# Patient Record
Sex: Female | Born: 1965 | Race: Black or African American | Hispanic: No | Marital: Single
Health system: Southern US, Community
[De-identification: ages and names within clinical notes are randomized; demographics above are authoritative.]

---

## 2020-03-22 NOTE — Progress Notes (Unsigned)
[name] [address] [place] [address] Phone: [phone] Subjective:   Bruce Donath, am serving as a scribe for Dr. Antoine Primas. This visit occurred during the SARS-CoV-2 public health emergency.  Safety protocols were in place, including screening questions prior to the visit, additional usage of staff PPE, and extensive cleaning of exam room while observing appropriate contact time as indicated for disinfecting solutions.   I'm seeing this patient by the request  of:  Patient, No Pcp Per  CC: Right wrist pain and hand pain  [id number]  Andrea Cowan is a 55 y.o. female coming in with complaint of right hand and wrist pain. Patient states that symptoms began last year as tingling in all of her fingertips on right hand. Thumb started to catch and is now not able to flex thumb. Pain in [facility] joint and dorsum of wrist. States that "swelling" pops up in this wrist intermittently. 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      Social History   Socioeconomic History  . Marital status: Married    Spouse name: Not on file  . Number of children: Not on file  . Years of education: Not on file  . Highest education level: Not on file  Occupational History  . Not on file  Tobacco Use  . Smoking status: Not on file  . Smokeless tobacco: Not on file  Substance and Sexual Activity  . Alcohol use: Not on file  . Drug use: Not on file  . Sexual activity: Not on file  Other Topics Concern  . Not on file  Social History Narrative  . Not on file   Social Determinants of Health   Financial Resource Strain: Not on file  Food Insecurity: Not on file  Transportation Needs: Not on file  Physical Activity: Not on file  Stress: Not on file  Social Connections: Not on file   Not on File No family history on file.   Current Outpatient Medications (Cardiovascular):  .  triamterene-hydrochlorothiazide (DYAZIDE) 37.5-25 MG capsule,  Take 1 capsule by mouth daily.       Reviewed prior external information including notes and imaging from  primary care provider As well as notes that were available from care everywhere and other healthcare systems.  Past medical history, social, surgical and family history all reviewed in electronic medical record.  No pertanent information unless stated regarding to the chief complaint.   Review of Systems:  No headache, visual changes, nausea, vomiting, diarrhea, constipation, dizziness, abdominal pain, skin rash, fevers, chills, night sweats, weight loss, swollen lymph nodes, body aches, joint swelling, chest pain, shortness of breath, mood changes. POSITIVE muscle aches  Objective  Blood pressure 112/80, pulse 94, height 5\' 2"  (1.575 m), weight 165 lb (74.8 kg), SpO2 98 %.   General: No apparent distress alert and oriented x3 mood and affect normal, dressed appropriately.  HEENT: Pupils equal, extraocular movements intact  Respiratory: Patient's speak in full sentences and does not appear short of breath  Cardiovascular: No lower extremity edema, non tender, no erythema  Gait normal with good balance and coordination.  MSK: Right hand exam shows the patient does have trigger nodule noted at the A2 pulley.  Severely tender to palpation.  Patient has difficulty on her own and moving her thumb at all.  I am able to flex patient's thumb down and then patient does have audible clicking when trying to extend again.  No pain over the [facility]  joint.  Positive Phalen's noted.  Negative Tinel's test though.  Good grip strength noted.  Limited musculoskeletal ultrasound was performed and interpreted by Judi Saa  Limited ultrasound shows the patient does have what appears to be a nodule within the tendon sheath of the first flexor tendon sheath of the right hand consistent with a trigger nodule.  Patient also has a bifid median nerve noted but no significant inflammation noted at this point.   Mild arthritic changes diffusely of the wrist otherwise. Impression: Trigger thumb and bifid median nerve that could be consistent with mild carpal tunnel    Impression and Recommendations:     The above documentation has been reviewed and is accurate and complete Judi Saa, DO

## 2020-03-23 ENCOUNTER — Encounter: Payer: Self-pay | Admitting: Family Medicine

## 2020-03-23 ENCOUNTER — Ambulatory Visit: Payer: BC Managed Care – PPO | Admitting: Family Medicine

## 2020-03-23 ENCOUNTER — Other Ambulatory Visit: Payer: Self-pay

## 2020-03-23 ENCOUNTER — Ambulatory Visit: Payer: Self-pay

## 2020-03-23 ENCOUNTER — Ambulatory Visit (INDEPENDENT_AMBULATORY_CARE_PROVIDER_SITE_OTHER): Payer: BC Managed Care – PPO

## 2020-03-23 VITALS — BP 112/80 | HR 94 | Ht 62.0 in | Wt 165.0 lb

## 2020-03-23 DIAGNOSIS — M65312 Trigger thumb, left thumb: Secondary | ICD-10-CM | POA: Insufficient documentation

## 2020-03-23 DIAGNOSIS — M65311 Trigger thumb, right thumb: Secondary | ICD-10-CM | POA: Diagnosis not present

## 2020-03-23 DIAGNOSIS — G5601 Carpal tunnel syndrome, right upper limb: Secondary | ICD-10-CM | POA: Diagnosis not present

## 2020-03-23 DIAGNOSIS — M79641 Pain in right hand: Secondary | ICD-10-CM | POA: Diagnosis not present

## 2020-03-23 DIAGNOSIS — G5600 Carpal tunnel syndrome, unspecified upper limb: Secondary | ICD-10-CM | POA: Insufficient documentation

## 2020-03-23 NOTE — Assessment & Plan Note (Signed)
Mild to moderate.  Patient does seem to have more of a bifid nerve noted.  Patient does have a mild positive Phalen's test but a negative Tinel's.  Good grip strength at the moment.  X-rays pending.  Thumb spica splint given to wear at night.  Hold on medication for worsening pain consider physical therapy as well as gabapentin.

## 2020-03-23 NOTE — Assessment & Plan Note (Signed)
Patient does have a trigger thumb noted.  We discussed with patient in great length.  Concerned that this has been going on for so long.  Patient wants to hold on any type of injection.  We did reiterate that that could be beneficial.  Discussed warm compresses, bracing at night, as well as massage.  Patient will follow up in 3 weeks and if not better we will consider injection and potentially occupational therapy secondary to the patient not using this for so long.

## 2020-03-23 NOTE — Patient Instructions (Addendum)
Warm compress and massage to area Thumb spica splint Bi-fib median nerve Wrist xray today See me in 4 weeks

## 2020-04-24 NOTE — Progress Notes (Signed)
[name] [address] [place] [address] Phone: [phone] Subjective:   Bruce Donath, am serving as a scribe for Dr. Antoine Primas. This visit occurred during the SARS-CoV-2 public health emergency.  Safety protocols were in place, including screening questions prior to the visit, additional usage of staff PPE, and extensive cleaning of exam room while observing appropriate contact time as indicated for disinfecting solutions.   I'm seeing this patient by the request  of:  Patient, No Pcp Per (Inactive)  CC: Hand pain follow-up  [id number]   03/23/2020 Mild to moderate.  Patient does seem to have more of a bifid nerve noted.  Patient does have a mild positive Phalen's test but a negative Tinel's.  Good grip strength at the moment.  X-rays pending.  Thumb spica splint given to wear at night.  Hold on medication for worsening pain consider physical therapy as well as gabapentin.  Patient does have a trigger thumb noted.  We discussed with patient in great length.  Concerned that this has been going on for so long.  Patient wants to hold on any type of injection.  We did reiterate that that could be beneficial.  Discussed warm compresses, bracing at night, as well as massage.  Patient will follow up in 3 weeks and if not better we will consider injection and potentially occupational therapy secondary to the patient not using this for so long.  Update 04/25/2020 Hollis Oh is a 55 y.o. female coming in with complaint of  R hand pain.  Seen previously for carpal tunnel as well as trigger thumb.  Patient was to do conservative therapy. Patient is not able to flex thumb and has constant pain.       No past medical history on file. No past surgical history on file. Social History   Socioeconomic History  . Marital status: Married    Spouse name: Not on file  . Number of children: Not on file  . Years of education: Not on file  . Highest  education level: Not on file  Occupational History  . Not on file  Tobacco Use  . Smoking status: Not on file  . Smokeless tobacco: Not on file  Substance and Sexual Activity  . Alcohol use: Not on file  . Drug use: Not on file  . Sexual activity: Not on file  Other Topics Concern  . Not on file  Social History Narrative  . Not on file   Social Determinants of Health   Financial Resource Strain: Not on file  Food Insecurity: Not on file  Transportation Needs: Not on file  Physical Activity: Not on file  Stress: Not on file  Social Connections: Not on file   Not on File No family history on file.   Current Outpatient Medications (Cardiovascular):  .  triamterene-hydrochlorothiazide (DYAZIDE) 37.5-25 MG capsule, Take 1 capsule by mouth daily.       Reviewed prior external information including notes and imaging from  primary care provider As well as notes that were available from care everywhere and other healthcare systems.  Past medical history, social, surgical and family history all reviewed in electronic medical record.  No pertanent information unless stated regarding to the chief complaint.   Review of Systems:  No headache, visual changes, nausea, vomiting, diarrhea, constipation, dizziness, abdominal pain, skin rash, fevers, chills, night sweats, weight loss, swollen lymph nodes, body aches, joint swelling, chest pain, shortness of breath, mood changes. POSITIVE muscle  aches  Objective  There were no vitals taken for this visit.   General: No apparent distress alert and oriented x3 mood and affect normal, dressed appropriately.  HEENT: Pupils equal, extraocular movements intact  Respiratory: Patient's speak in full sentences and does not appear short of breath  Cardiovascular: No lower extremity edema, non tender, no erythema  Gait normal with good balance and coordination.  MSK:  Right hand exam shows the patient does have significant decrease in range of  motion of the thumb with painful extension.  Trigger nodule noted at the A2 pulley.  When isolating the DIP patient does still have some mild flexion mild against resistance.  Neurovascular intact distally.  Procedure: Real-time Ultrasound Guided Injection of right thumb flexor tendon sheath Device: GE Logiq Q7 Ultrasound guided injection is preferred based studies that show increased duration, increased effect, greater accuracy, decreased procedural pain, increased response rate, and decreased cost with ultrasound guided versus blind injection.  Verbal informed consent obtained.  Time-out conducted.  Noted no overlying erythema, induration, or other signs of local infection.  Skin prepped in a sterile fashion.  Local anesthesia: Topical Ethyl chloride.  With sterile technique and under real time ultrasound guidance: With a 25-gauge 1 inch needle injected with 0.5 cc of 0.5% Marcaine and 0.5 cc of Kenalog mentioned. Completed without difficulty  Pain immediately resolved suggesting accurate placement of the medication.  Advised to call if fevers/chills, erythema, induration, drainage, or persistent bleeding.  Impression: Technically successful ultrasound guided injection.    Impression and Recommendations:     The above documentation has been reviewed and is accurate and complete Judi Saa, DO

## 2020-04-25 ENCOUNTER — Other Ambulatory Visit: Payer: Self-pay

## 2020-04-25 ENCOUNTER — Ambulatory Visit: Payer: BC Managed Care – PPO | Admitting: Family Medicine

## 2020-04-25 ENCOUNTER — Ambulatory Visit: Payer: Self-pay

## 2020-04-25 ENCOUNTER — Encounter: Payer: Self-pay | Admitting: Family Medicine

## 2020-04-25 VITALS — BP 126/88 | HR 72 | Ht 62.0 in | Wt 167.0 lb

## 2020-04-25 DIAGNOSIS — M79641 Pain in right hand: Secondary | ICD-10-CM

## 2020-04-25 DIAGNOSIS — M65311 Trigger thumb, right thumb: Secondary | ICD-10-CM | POA: Diagnosis not present

## 2020-04-25 NOTE — Patient Instructions (Addendum)
Injected thumb today May take 2 weeks or so to start working Wear brace at night See me again in 6 weeks

## 2020-04-25 NOTE — Assessment & Plan Note (Signed)
Patient given injection today.  Tolerated the procedure well.  Failed conservative therapy.  I am optimistic that patient should respond relatively well.  Discussed with patient to start working on the range of motion noted.  We discussed bracing only at night.  Follow-up with me again 6 weeks to further evaluate.  Worsening pain nonspecific inflammation or redness patient is a 59 medical attention.

## 2020-06-02 NOTE — Progress Notes (Deleted)
  [name] [address] [place] [address] Phone: [phone] Subjective:    I'm seeing this patient by the request  of:  Patient, No Pcp Per (Inactive)  CC:   [id number]   04/25/2020 Patient given injection today.  Tolerated the procedure well.  Failed conservative therapy.  I am optimistic that patient should respond relatively well.  Discussed with patient to start working on the range of motion noted.  We discussed bracing only at night.  Follow-up with me again 6 weeks to further evaluate.  Worsening pain nonspecific inflammation or redness patient is a 60 medical attention  Update 06/06/2020 Andrea Cowan is a 55 y.o. female coming in with complaint of R trigger thumb. Patient states   Onset-  Location Duration-  Character- Aggravating factors- Reliving factors-  Therapies tried-  Severity-     No past medical history on file. No past surgical history on file. Social History   Socioeconomic History  . Marital status: Married    Spouse name: Not on file  . Number of children: Not on file  . Years of education: Not on file  . Highest education level: Not on file  Occupational History  . Not on file  Tobacco Use  . Smoking status: Not on file  . Smokeless tobacco: Not on file  Substance and Sexual Activity  . Alcohol use: Not on file  . Drug use: Not on file  . Sexual activity: Not on file  Other Topics Concern  . Not on file  Social History Narrative  . Not on file   Social Determinants of Health   Financial Resource Strain: Not on file  Food Insecurity: Not on file  Transportation Needs: Not on file  Physical Activity: Not on file  Stress: Not on file  Social Connections: Not on file   Not on File No family history on file.   Current Outpatient Medications (Cardiovascular):  .  triamterene-hydrochlorothiazide (DYAZIDE) 37.5-25 MG capsule, Take 1 capsule by mouth daily.       Reviewed prior  external information including notes and imaging from  primary care provider As well as notes that were available from care everywhere and other healthcare systems.  Past medical history, social, surgical and family history all reviewed in electronic medical record.  No pertanent information unless stated regarding to the chief complaint.   Review of Systems:  No headache, visual changes, nausea, vomiting, diarrhea, constipation, dizziness, abdominal pain, skin rash, fevers, chills, night sweats, weight loss, swollen lymph nodes, body aches, joint swelling, chest pain, shortness of breath, mood changes. POSITIVE muscle aches  Objective  There were no vitals taken for this visit.   General: No apparent distress alert and oriented x3 mood and affect normal, dressed appropriately.  HEENT: Pupils equal, extraocular movements intact  Respiratory: Patient's speak in full sentences and does not appear short of breath  Cardiovascular: No lower extremity edema, non tender, no erythema  Gait normal with good balance and coordination.  MSK:  Non tender with full range of motion and good stability and symmetric strength and tone of shoulders, elbows, wrist, hip, knee and ankles bilaterally.     Impression and Recommendations:     The above documentation has been reviewed and is accurate and complete Wilford Grist

## 2020-06-06 ENCOUNTER — Ambulatory Visit: Payer: BC Managed Care – PPO | Admitting: Family Medicine

## 2021-04-03 NOTE — Progress Notes (Deleted)
?  Andrea Files D.O. ?[facility] ?[address] [place] [address] ?Phone: [phone] ?Subjective:   ? ?I'm seeing this patient by the request  of:  Patient, No Pcp Per (Inactive) ? ?CC:  ? ?[id number]  ?04/25/2020 ?Patient given injection today.  Tolerated the procedure well.  Failed conservative therapy.  I am optimistic that patient should respond relatively well.  Discussed with patient to start working on the range of motion noted.  We discussed bracing only at night.  Follow-up with me again 6 weeks to further evaluate.  Worsening pain nonspecific inflammation or redness patient is a 60 medical attention. ? ?Updated 04/04/2021 ?Andrea Cowan is a 56 y.o. female coming in with complaint of hand and wrist pain ? ? ? ?  ? ?No past medical history on file. ?No past surgical history on file. ?Social History  ? ?Socioeconomic History  ? Marital status: Married  ?  Spouse name: Not on file  ? Number of children: Not on file  ? Years of education: Not on file  ? Highest education level: Not on file  ?Occupational History  ? Not on file  ?Tobacco Use  ? Smoking status: Not on file  ? Smokeless tobacco: Not on file  ?Substance and Sexual Activity  ? Alcohol use: Not on file  ? Drug use: Not on file  ? Sexual activity: Not on file  ?Other Topics Concern  ? Not on file  ?Social History Narrative  ? Not on file  ? ?Social Determinants of Health  ? ?Financial Resource Strain: Not on file  ?Food Insecurity: Not on file  ?Transportation Needs: Not on file  ?Physical Activity: Not on file  ?Stress: Not on file  ?Social Connections: Not on file  ? ?Not on File ?No family history on file. ? ? ?Current Outpatient Medications (Cardiovascular):  ?  triamterene-hydrochlorothiazide (DYAZIDE) 37.5-25 MG capsule, Take 1 capsule by mouth daily. ? ? ? ? ?Reviewed prior external information including notes and imaging from  ?primary care provider ?As well as notes that were available from care everywhere and other  healthcare systems. ? ?Past medical history, social, surgical and family history all reviewed in electronic medical record.  No pertanent information unless stated regarding to the chief complaint.  ? ?Review of Systems: ? No headache, visual changes, nausea, vomiting, diarrhea, constipation, dizziness, abdominal pain, skin rash, fevers, chills, night sweats, weight loss, swollen lymph nodes, body aches, joint swelling, chest pain, shortness of breath, mood changes. POSITIVE muscle aches ? ?Objective  ?There were no vitals taken for this visit. ?  ?General: No apparent distress alert and oriented x3 mood and affect normal, dressed appropriately.  ?HEENT: Pupils equal, extraocular movements intact  ?Respiratory: Patient's speak in full sentences and does not appear short of breath  ?Cardiovascular: No lower extremity edema, non tender, no erythema  ?Gait normal with good balance and coordination.  ?MSK:   ? ?  ?Impression and Recommendations:  ?  ? ?The above documentation has been reviewed and is accurate and complete Andrea Cowan ? ? ? ?

## 2021-04-04 ENCOUNTER — Ambulatory Visit: Payer: BC Managed Care – PPO | Admitting: Family Medicine

## 2021-04-17 ENCOUNTER — Ambulatory Visit: Payer: BC Managed Care – PPO | Admitting: Family Medicine

## 2021-04-17 ENCOUNTER — Ambulatory Visit: Payer: Self-pay

## 2021-04-17 VITALS — BP 124/84 | HR 82 | Ht 62.0 in | Wt 180.0 lb

## 2021-04-17 DIAGNOSIS — M65312 Trigger thumb, left thumb: Secondary | ICD-10-CM | POA: Diagnosis not present

## 2021-04-17 DIAGNOSIS — M79642 Pain in left hand: Secondary | ICD-10-CM

## 2021-04-17 DIAGNOSIS — M79641 Pain in right hand: Secondary | ICD-10-CM

## 2021-04-17 DIAGNOSIS — M65311 Trigger thumb, right thumb: Secondary | ICD-10-CM

## 2021-04-17 NOTE — Assessment & Plan Note (Signed)
Bilateral injections given today.  Chronic, with exacerbation.  Patient does report significant repetitive activity with her thumbs that is likely contributing.  Patient is to continue to do so she states.  We discussed bracing.  Discussed massage and heat follow-up with me again in 2 months.  Did discuss if this continues to give trouble possible surgical intervention will be necessary. ?

## 2021-04-17 NOTE — Patient Instructions (Addendum)
Good to see you ?Injected both hands today ?Have fun Myrtle  ?See me in 2 months ?

## 2021-04-17 NOTE — Progress Notes (Signed)
?Andrea Cowan D.O. ?[facility] ?[place] ?Phone: [phone] ?Subjective:   ?I, Andrea Cowan, am serving as a scribe for Dr. Hulan Cowan. ? ?I'm seeing this patient by the request  of:  Patient, No Pcp Per (Inactive) ? ?CC: bilateral thumb  ? ?[id number]  ?Andrea Cowan is a 56 y.o. female coming in with complaint of bilateral thumb and wrist pain. Stated with the trigger finger where she has to pull her thumb open. Patient is not having any pain n the right thumb she just cannot bend it. The left thumb however is painful and worse if she hits it on anything, also is unable to bend her thumb. The right thumb has been a couple a months this way the left thumb started clicking recently about a month ago. Patient has been trying to massage the area to help with the pain, takes tylenol when the pain gets really bad.   ? ? ? ?  ? ?No past medical history on file. ?No past surgical history on file. ?Social History  ? ?Socioeconomic History  ? Marital status: Married  ?  Spouse name: Not on file  ? Number of children: Not on file  ? Years of education: Not on file  ? Highest education level: Not on file  ?Occupational History  ? Not on file  ?Tobacco Use  ? Smoking status: Not on file  ? Smokeless tobacco: Not on file  ?Substance and Sexual Activity  ? Alcohol use: Not on file  ? Drug use: Not on file  ? Sexual activity: Not on file  ?Other Topics Concern  ? Not on file  ?Social History Narrative  ? Not on file  ? ?Social Determinants of Health  ? ?Financial Resource Strain: Not on file  ?Food Insecurity: Not on file  ?Transportation Needs: Not on file  ?Physical Activity: Not on file  ?Stress: Not on file  ?Social Connections: Not on file  ? ?Not on File ?No family history on file. ? ? ?Current Outpatient Medications (Cardiovascular):  ?  amLODipine (NORVASC) 10 MG tablet, Take 10 mg by mouth daily. ?  metoprolol succinate (TOPROL-XL) 25 MG 24 hr tablet, Take 25 mg by  mouth daily. ? ? ? ? ? ?Reviewed prior external information including notes and imaging from  ?primary care provider ?As well as notes that were available from care everywhere and other healthcare systems. ? ?Past medical history, social, surgical and family history all reviewed in electronic medical record.  No pertanent information unless stated regarding to the chief complaint.  ? ?Review of Systems: ? No headache, visual changes, nausea, vomiting, diarrhea, constipation, dizziness, abdominal pain, skin rash, fevers, chills, night sweats, weight loss, swollen lymph nodes, body aches, joint swelling, chest pain, shortness of breath, mood changes. POSITIVE muscle aches ? ?Objective  ?Blood pressure 124/84, pulse 82, height 5\' 2"  (1.575 m), weight 180 lb (81.6 kg), SpO2 98 %. ?  ?General: No apparent distress alert and oriented x3 mood and affect normal, dressed appropriately.  ?HEENT: Pupils equal, extraocular movements intact  ?Respiratory: Patient's speak in full sentences and does not appear short of breath  ?Cardiovascular: No lower extremity edema, non tender, no erythema  ?Gait normal with good balance and coordination.  ?MSK: Bilateral hand exam shows the patient does have some trigger nodules noted at the A2 Cowan of the thumb bilaterally.  Patient does have full range of motion though.  No CMC tenderness noted.  Negative Tinel's ? ?  Procedure: Real-time Ultrasound Guided Injection of right flexor tendon sheath of the first finger ?Device: GE Logiq Q7 ?Ultrasound guided injection is preferred based studies that show increased duration, increased effect, greater accuracy, decreased procedural pain, increased response rate, and decreased cost with ultrasound guided versus blind injection.  ?Verbal informed consent obtained.  ?Time-out conducted.  ?Noted no overlying erythema, induration, or other signs of local infection.  ?Skin prepped in a sterile fashion.  ?Local anesthesia: Topical Ethyl chloride.  ?With  sterile technique and under real time ultrasound guidance: With a 25-gauge half inch needle injected with 0.5 cc of 0.5% Marcaine and 0.5 cc of Kenalog 40 mg/mL ?Completed without difficulty  ?Pain immediately improved suggesting accurate placement of the medication.  ?Advised to call if fevers/chills, erythema, induration, drainage, or persistent bleeding.  ?Impression: Technically successful ultrasound guided injection. ? ?Procedure: Real-time Ultrasound Guided Injection of flexor tendon sheath of the first finger on the left side ?Device: GE Logiq Q7 ?Ultrasound guided injection is preferred based studies that show increased duration, increased effect, greater accuracy, decreased procedural pain, increased response rate, and decreased cost with ultrasound guided versus blind injection.  ?Verbal informed consent obtained.  ?Time-out conducted.  ?Noted no overlying erythema, induration, or other signs of local infection.  ?Skin prepped in a sterile fashion.  ?Local anesthesia: Topical Ethyl chloride.  ?With sterile technique and under real time ultrasound guidance: With a 25-gauge half inch needle injected with 0.5 cc of 0.5% Marcaine and 0.5 cc of Kenalog 40 mg/mL ?Completed without difficulty  ?Pain immediately improved suggesting accurate placement of the medication.  ?Advised to call if fevers/chills, erythema, induration, drainage, or persistent bleeding.  ?Impression: Technically successful ultrasound guided injection. ? ?  ?Impression and Recommendations:  ?  ? ?The above documentation has been reviewed and is accurate and complete Andrea Pulley, DO ? ? ? ?

## 2021-05-17 ENCOUNTER — Ambulatory Visit: Payer: BC Managed Care – PPO | Admitting: Family Medicine

## 2021-06-19 NOTE — Progress Notes (Deleted)
  [facility] [address] [place] [address] Phone: [phone] Subjective:    I'm seeing this patient by the request  of:  Patient, No Pcp Per (Inactive)  CC: hand and wrist pain follow up   [id number]  04/17/2021 Bilateral injections given today.  Chronic, with exacerbation.  Patient does report significant repetitive activity with her thumbs that is likely contributing.  Patient is to continue to do so she states.  We discussed bracing.  Discussed massage and heat follow-up with me again in 2 months.  Did discuss if this continues to give trouble possible surgical intervention will be necessary.  Updated 06/20/2021 Andrea Cowan is a 56 y.o. female coming in with complaint of hand and wrist pain       No past medical history on file. No past surgical history on file. Social History   Socioeconomic History   Marital status: Married    Spouse name: Not on file   Number of children: Not on file   Years of education: Not on file   Highest education level: Not on file  Occupational History   Not on file  Tobacco Use   Smoking status: Not on file   Smokeless tobacco: Not on file  Substance and Sexual Activity   Alcohol use: Not on file   Drug use: Not on file   Sexual activity: Not on file  Other Topics Concern   Not on file  Social History Narrative   Not on file   Social Determinants of Health   Financial Resource Strain: Not on file  Food Insecurity: Not on file  Transportation Needs: Not on file  Physical Activity: Not on file  Stress: Not on file  Social Connections: Not on file   Not on File No family history on file.   Current Outpatient Medications (Cardiovascular):    amLODipine (NORVASC) 10 MG tablet, Take 10 mg by mouth daily.   metoprolol succinate (TOPROL-XL) 25 MG 24 hr tablet, Take 25 mg by mouth daily.       Reviewed prior external information including notes and imaging from  primary care  provider As well as notes that were available from care everywhere and other healthcare systems.  Past medical history, social, surgical and family history all reviewed in electronic medical record.  No pertanent information unless stated regarding to the chief complaint.   Review of Systems:  No headache, visual changes, nausea, vomiting, diarrhea, constipation, dizziness, abdominal pain, skin rash, fevers, chills, night sweats, weight loss, swollen lymph nodes, body aches, joint swelling, chest pain, shortness of breath, mood changes. POSITIVE muscle aches  Objective  There were no vitals taken for this visit.   General: No apparent distress alert and oriented x3 mood and affect normal, dressed appropriately.  HEENT: Pupils equal, extraocular movements intact  Respiratory: Patient's speak in full sentences and does not appear short of breath  Cardiovascular: No lower extremity edema, non tender, no erythema      Impression and Recommendations:

## 2021-06-20 ENCOUNTER — Ambulatory Visit: Payer: BC Managed Care – PPO | Admitting: Family Medicine

## 2022-02-13 NOTE — Progress Notes (Signed)
[facility] Phone: [phone] Subjective:   [name], am serving as a scribe for Dr. Hulan Saas.  I'm seeing this patient by the request  of:  Patient, No Pcp Per  CC: Left foot, right thumb pain  LPF:[id number]  Andrea Cowan is a 57 y.o. female coming in with complaint of B wrist pain. Last seen in April 2023 for B [facility] joint trigger finger. Patient states that she is having R CMC joint pain and numbness in 2nd-4th fingers on R hand. L hand is doing ok at this time. Patient notes a knot over the the IP jt of the R thumb.   L heel pain for 1 year. Had injection on dorsum of L foot 2 weeks ago that has not helped her pain. Pain with initial weight bearing and then after prolonged walking/standing.       No past medical history on file. No past surgical history on file. Social History   Socioeconomic History   Marital status: Single    Spouse name: Not on file   Number of children: Not on file   Years of education: Not on file   Highest education level: Not on file  Occupational History   Not on file  Tobacco Use   Smoking status: Not on file   Smokeless tobacco: Not on file  Substance and Sexual Activity   Alcohol use: Not on file   Drug use: Not on file   Sexual activity: Not on file  Other Topics Concern   Not on file  Social History Narrative   Not on file   Social Determinants of Health   Financial Resource Strain: Not on file  Food Insecurity: Not on file  Transportation Needs: Not on file  Physical Activity: Not on file  Stress: Not on file  Social Connections: Not on file   Not on File No family history on file.   Current Outpatient Medications (Cardiovascular):    amLODipine (NORVASC) 10 MG tablet, Take 10 mg by mouth daily.   metoprolol succinate (TOPROL-XL) 25 MG 24 hr tablet, Take 25 mg by mouth daily.       Reviewed prior external information including  notes and imaging from  primary care provider As well as notes that were available from care everywhere and other healthcare systems.  Past medical history, social, surgical and family history all reviewed in electronic medical record.  No pertanent information unless stated regarding to the chief complaint.   Review of Systems:  No headache, visual changes, nausea, vomiting, diarrhea, constipation, dizziness, abdominal pain, skin rash, fevers, chills, night sweats, weight loss, swollen lymph nodes, body aches, joint swelling, chest pain, shortness of breath, mood changes. POSITIVE muscle aches  Objective  Blood pressure 130/82, pulse 62, height 5\' 2"  (1.575 m), weight 178 lb (80.7 kg), SpO2 99 %.   General: No apparent distress alert and oriented x3 mood and affect normal, dressed appropriately.  HEENT: Pupils equal, extraocular movements intact  Respiratory: Patient's speak in full sentences and does not appear short of breath  Cardiovascular: No lower extremity edema, non tender, no erythema   Right thumb does have a trigger nodule noted at the 2 pulley release.  Patient does have some tenderness to palpation noted..  Left foot is tender to palpation over the medial calcaneal area.  Procedure: Real-time Ultrasound Guided Injection of right first flexor tendon sheath Device: GE Logiq Q7 Ultrasound guided injection is preferred  based studies that show increased duration, increased effect, greater accuracy, decreased procedural pain, increased response rate, and decreased cost with ultrasound guided versus blind injection.  Verbal informed consent obtained.  Time-out conducted.  Noted no overlying erythema, induration, or other signs of local infection.  Skin prepped in a sterile fashion.  Local anesthesia: Topical Ethyl chloride.  With sterile technique and under real time ultrasound guidance: With a 25-gauge half inch needle injecting 0.5 cc of 0.5% Marcaine and 0.5 cc of Kenalog 40  mg/mL Completed without difficulty  Pain immediately resolved suggesting accurate placement of the medication.  Advised to call if fevers/chills, erythema, induration, drainage, or persistent bleeding.  Impression: Technically successful ultrasound guided injection.   Impression and Recommendations:    The above documentation has been reviewed and is accurate and complete Lyndal Pulley, DO

## 2022-02-14 ENCOUNTER — Ambulatory Visit: Payer: BC Managed Care – PPO | Admitting: Family Medicine

## 2022-02-14 ENCOUNTER — Ambulatory Visit: Payer: Self-pay

## 2022-02-14 VITALS — BP 130/82 | HR 62 | Ht 62.0 in | Wt 178.0 lb

## 2022-02-14 DIAGNOSIS — M722 Plantar fascial fibromatosis: Secondary | ICD-10-CM | POA: Diagnosis not present

## 2022-02-14 DIAGNOSIS — M65311 Trigger thumb, right thumb: Secondary | ICD-10-CM

## 2022-02-14 DIAGNOSIS — M25531 Pain in right wrist: Secondary | ICD-10-CM | POA: Diagnosis not present

## 2022-02-14 DIAGNOSIS — M25532 Pain in left wrist: Secondary | ICD-10-CM

## 2022-02-14 DIAGNOSIS — M65312 Trigger thumb, left thumb: Secondary | ICD-10-CM

## 2022-02-14 NOTE — Assessment & Plan Note (Signed)
Patient does do a lot of repetitive activity.  We discussed potential stretching and bracing at night.  Discussed with patient about massage as well in the area.  Chronic problem with worsening symptoms.  Still likely not affecting her livelihood significantly.  Follow-up again in 6 to 8 weeks.

## 2022-02-14 NOTE — Assessment & Plan Note (Signed)
We reviewed that stretching is critically important to the treatment of PF.  Reviewed footwear. Rigid soles have been shown to help with PF. Night splints can sometimes help. Reviewed rehab of stretching and calf raises.   Could benefit from a corticosteroid injection, orthotics, or other measures if conservative treatment fails. Patient will try over-the-counter orthotics initially as well as recovery sandals.

## 2022-02-14 NOTE — Patient Instructions (Addendum)
Wear supportive shoes with a rigid sole [employer] Total Support Orthotics Original OOFOS recovery sandals in the house  Injected thumb today Do exercises 3x a week See me again in 6-8 weeks

## 2022-03-27 NOTE — Progress Notes (Deleted)
  [place] Phone: [phone] Subjective:    I'm seeing this patient by the request  of:  Patient, No Pcp Per  CC:   [id number]  02/14/2022 We reviewed that stretching is critically important to the treatment of PF.   Reviewed footwear. Rigid soles have been shown to help with PF. Night splints can sometimes help. Reviewed rehab of stretching and calf raises.    Could benefit from a corticosteroid injection, orthotics, or other measures if conservative treatment fails. Patient will try over-the-counter orthotics initially as well as recovery sandals.     Patient does do a lot of repetitive activity.  We discussed potential stretching and bracing at night.  Discussed with patient about massage as well in the area.  Chronic problem with worsening symptoms.  Still likely not affecting her livelihood significantly.  Follow-up again in 6 to 8 weeks.      Update 03/28/2022 Andrea Cowan is a 57 y.o. female coming in with complaint of L heel and R CMC joint pain. Patient states       No past medical history on file. No past surgical history on file. Social History   Socioeconomic History   Marital status: Single    Spouse name: Not on file   Number of children: Not on file   Years of education: Not on file   Highest education level: Not on file  Occupational History   Not on file  Tobacco Use   Smoking status: Not on file   Smokeless tobacco: Not on file  Substance and Sexual Activity   Alcohol use: Not on file   Drug use: Not on file   Sexual activity: Not on file  Other Topics Concern   Not on file  Social History Narrative   Not on file   Social Determinants of Health   Financial Resource Strain: Not on file  Food Insecurity: Not on file  Transportation Needs: Not on file  Physical Activity: Not on file  Stress: Not on file  Social Connections: Not on file   Not on File No family  history on file.   Current Outpatient Medications (Cardiovascular):    amLODipine (NORVASC) 10 MG tablet, Take 10 mg by mouth daily.   metoprolol succinate (TOPROL-XL) 25 MG 24 hr tablet, Take 25 mg by mouth daily.       Reviewed prior external information including notes and imaging from  primary care provider As well as notes that were available from care everywhere and other healthcare systems.  Past medical history, social, surgical and family history all reviewed in electronic medical record.  No pertanent information unless stated regarding to the chief complaint.   Review of Systems:  No headache, visual changes, nausea, vomiting, diarrhea, constipation, dizziness, abdominal pain, skin rash, fevers, chills, night sweats, weight loss, swollen lymph nodes, body aches, joint swelling, chest pain, shortness of breath, mood changes. POSITIVE muscle aches  Objective  There were no vitals taken for this visit.   General: No apparent distress alert and oriented x3 mood and affect normal, dressed appropriately.  HEENT: Pupils equal, extraocular movements intact  Respiratory: Patient's speak in full sentences and does not appear short of breath  Cardiovascular: No lower extremity edema, non tender, no erythema      Impression and Recommendations:

## 2022-03-28 ENCOUNTER — Ambulatory Visit: Payer: BC Managed Care – PPO | Admitting: Family Medicine

## 2022-06-01 IMAGING — DX DG WRIST COMPLETE 3+V*R*
4 series · 4 of 4 positions shown · non-contrast
Comparison: None.

CLINICAL DATA: Right wrist pain. Thumb pain for 6 months. No known
injury.

EXAM:
RIGHT WRIST - COMPLETE 3+ VIEW

[wrist ap]
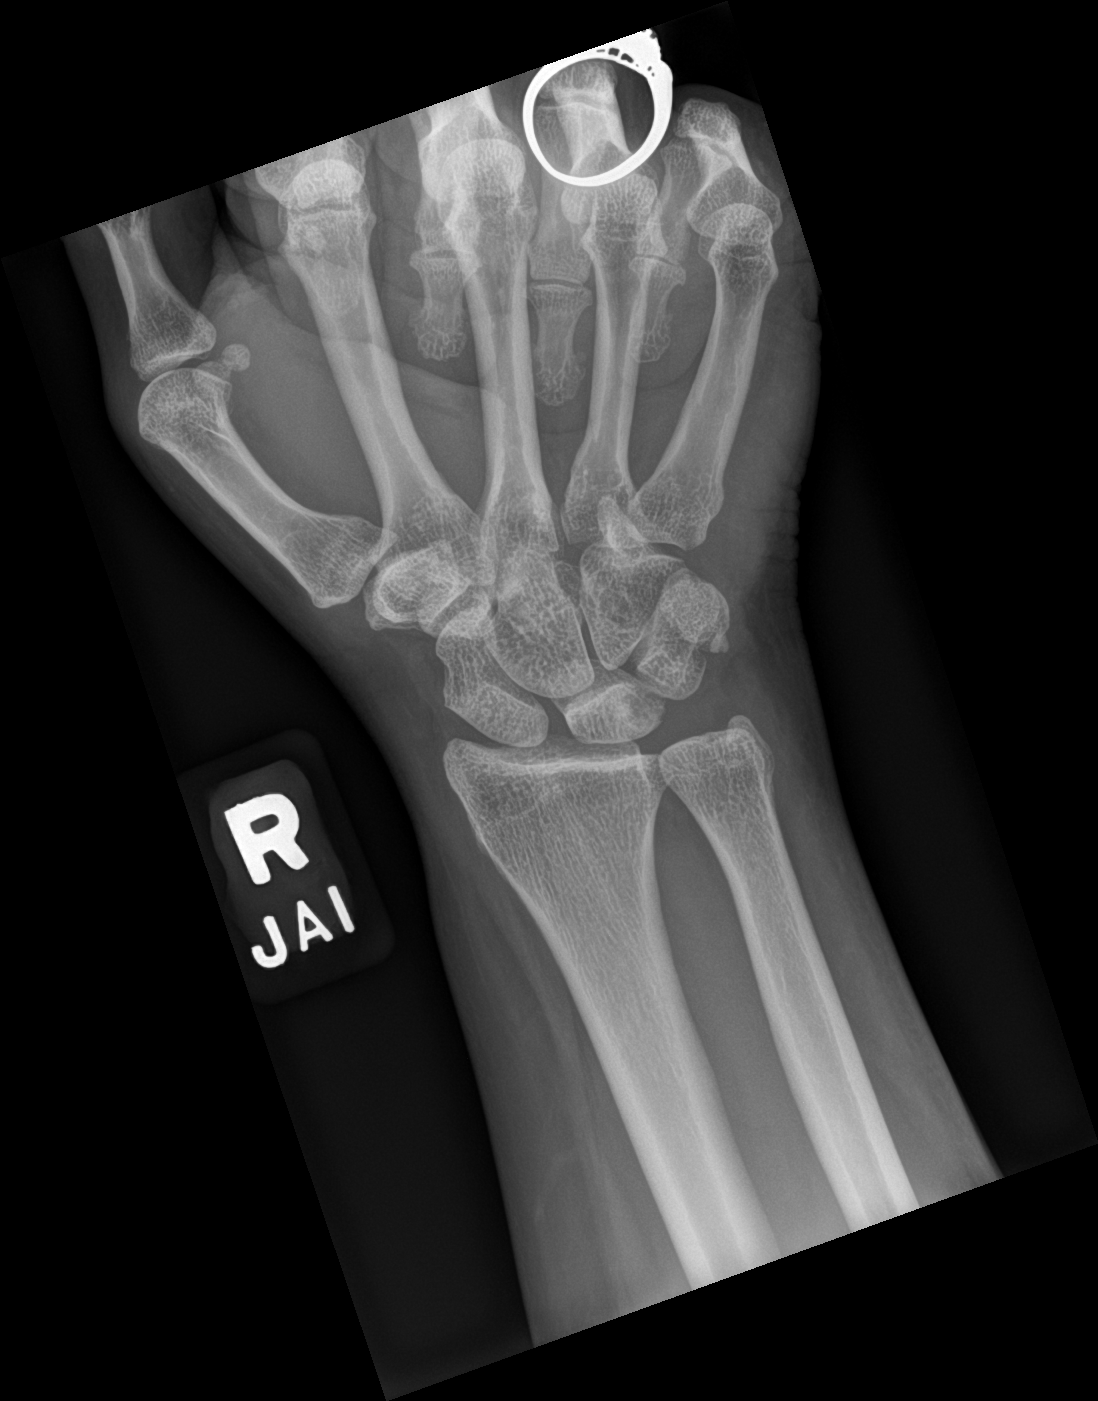

[wrist obl]
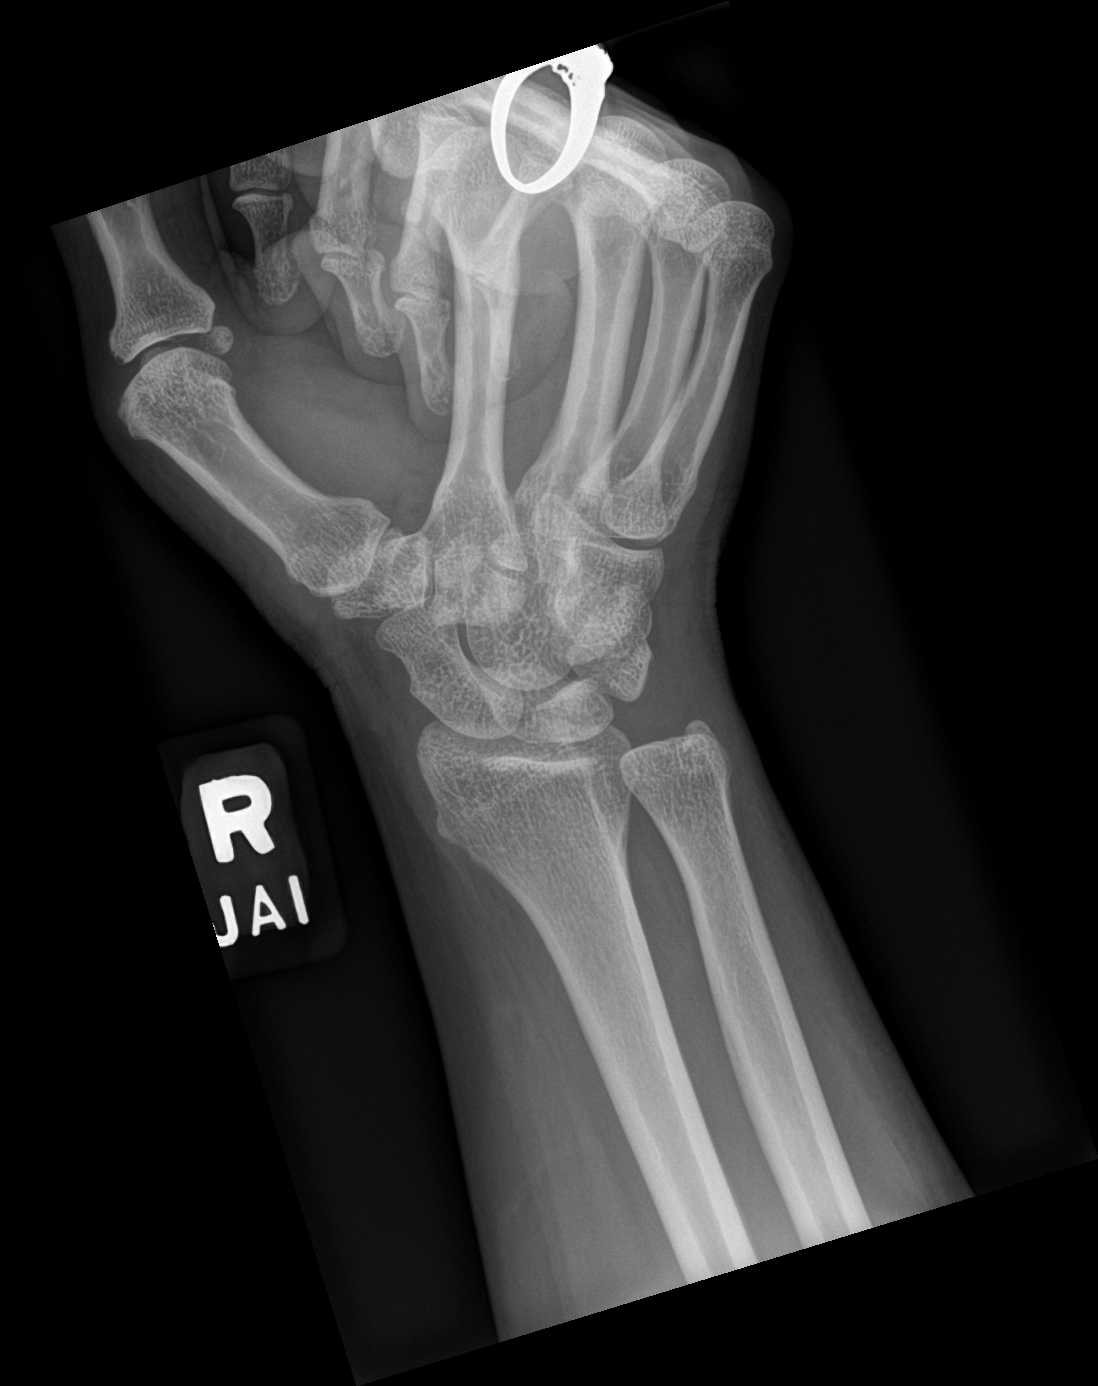

[wrist lat]
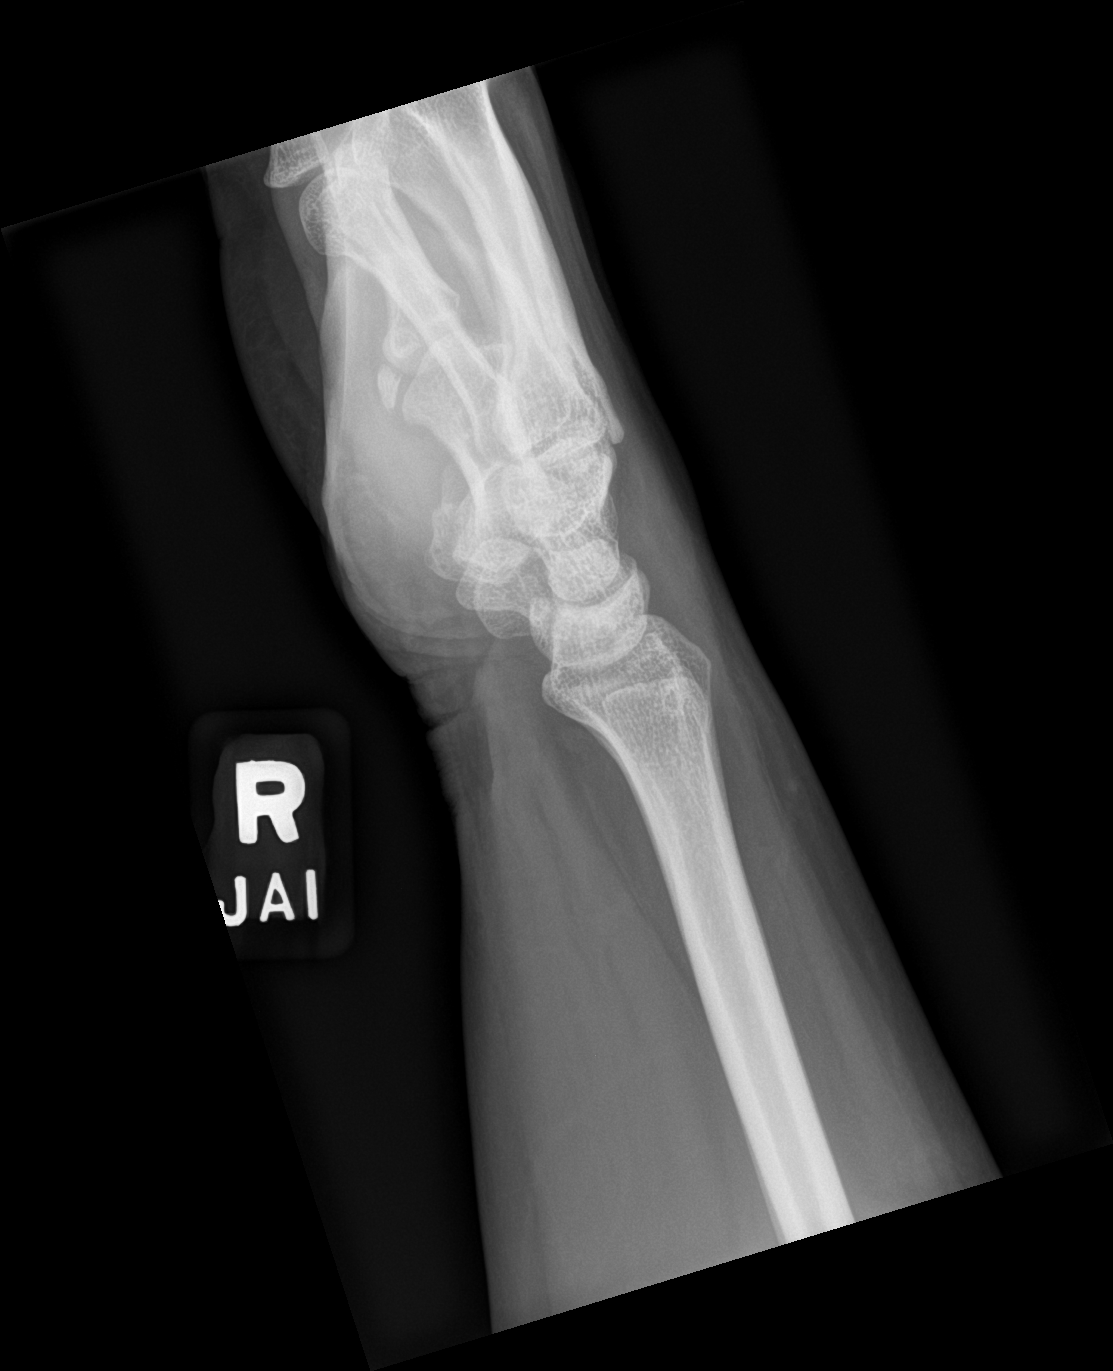

[scaphoid]
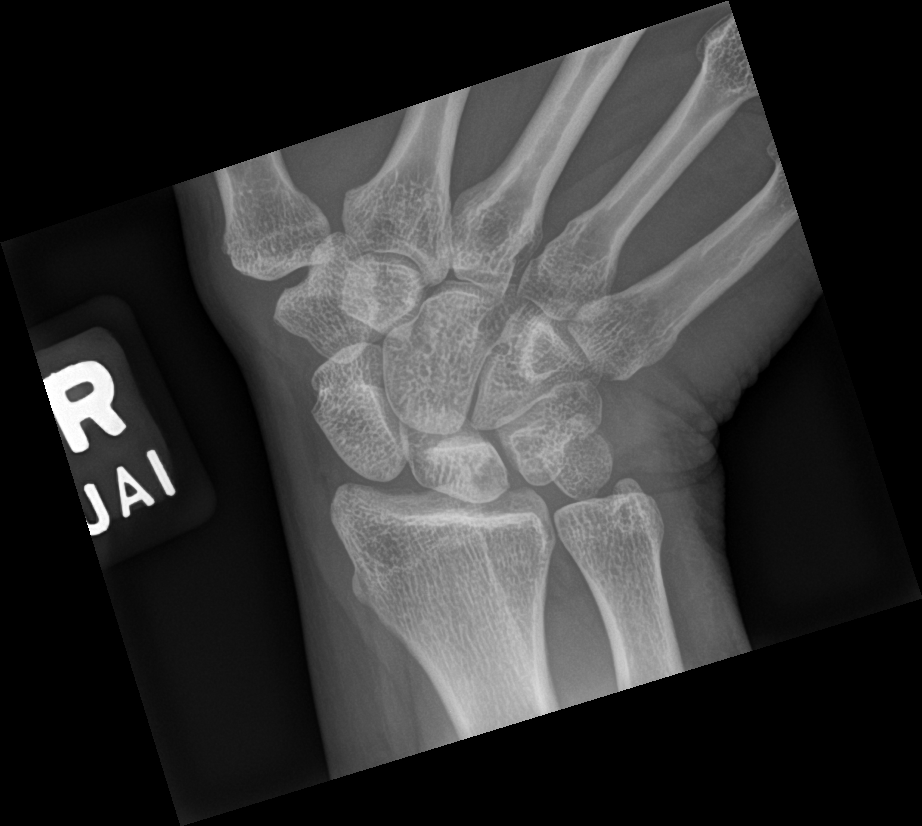

[4 of 4 positions shown; findings below may reference images not displayed]

FINDINGS: There is no evidence of fracture or dislocation. There is no
evidence of arthropathy or other focal bone abnormality. Soft
tissues are unremarkable.
IMPRESSION: Negative.
